# Patient Record
Sex: Male | Born: 1995 | Race: White | Hispanic: No | Marital: Married | Smoking: Former smoker
Health system: Southern US, Community
[De-identification: ages and names within clinical notes are randomized; demographics above are authoritative.]

## PROBLEM LIST (undated history)

## (undated) DIAGNOSIS — F909 Attention-deficit hyperactivity disorder, unspecified type: Secondary | ICD-10-CM

## (undated) HISTORY — DX: Attention-deficit hyperactivity disorder, unspecified type: F90.9

## (undated) HISTORY — PX: TONSILLECTOMY: SUR1361

---
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
## 2015-05-22 ENCOUNTER — Ambulatory Visit (INDEPENDENT_AMBULATORY_CARE_PROVIDER_SITE_OTHER): Payer: 59 | Admitting: Emergency Medicine

## 2015-05-22 VITALS — BP 130/90 | HR 63 | Temp 98.2°F | Resp 16 | Ht 73.25 in | Wt 306.0 lb

## 2015-05-22 DIAGNOSIS — L0390 Cellulitis, unspecified: Secondary | ICD-10-CM | POA: Diagnosis not present

## 2015-05-22 DIAGNOSIS — L0291 Cutaneous abscess, unspecified: Secondary | ICD-10-CM

## 2015-05-22 DIAGNOSIS — H6692 Otitis media, unspecified, left ear: Secondary | ICD-10-CM

## 2015-05-22 MED ORDER — AMOXICILLIN-POT CLAVULANATE 875-125 MG PO TABS: 1.0000 | ORAL_TABLET | Freq: Two times a day (BID) | ORAL | Status: AC

## 2015-05-22 NOTE — Patient Instructions (Signed)
I would like you to start the augmentin immediately. I am placing a referral to ENT.  If you develop fever, nausea, vomiting, sob, dizziness, eye pain--you need to return immediately.

## 2015-05-25 NOTE — Progress Notes (Signed)
  Medical screening examination/treatment/procedure(s) were performed by non-physician practitioner and as supervising physician I was immediately available for consultation/collaboration.     

## 2015-05-25 NOTE — Progress Notes (Signed)
[facility] [address], [place] [address] [phone]  Date:  05/22/2015   Name:  Shaun BaileyJohnathan L Gutierrez   DOB:  12/19/1995   MRN:  [id number]  PCP:  No primary care provider on file.   Chief Complaint  Patient presents with  . Otalgia    left ear x 2 days   . Flu Vaccine     History of Present Illness:  Shaun Gutierrez is a 19 y.o. male patient [facility] progressively worsening ear pain.  He states it is a throbbing pain at his ear, with pain that is radiating to his the left side of his face.   He has no hearing loss.  He did have a blood clot that came from his ear.  No dysequilibrium.  No fever, nausea, or dizziness.   No recent hx of upper respiratory disease.   No hx of ear infections.   There are no active problems to display for this patient.   Past Medical History  Diagnosis Date  . ADHD (attention deficit hyperactivity disorder)     History reviewed. No pertinent past surgical history.  Social History  Substance Use Topics  . Smoking status: Never Smoker   . Smokeless tobacco: None  . Alcohol Use: No    History reviewed. No pertinent family history.  No Known Allergies  Medication list has been reviewed and updated.  No current outpatient prescriptions on file prior to visit.   No current facility-administered medications on file prior to visit.    ROS ROS otherwise unremarkable unless listed above  Physical Examination: BP 130/90 mmHg  Pulse 63  Temp(Src) 98.2 F (36.8 C) (Oral)  Resp 16  Ht 6' 1.25" (1.861 m)  Wt 306 lb (138.801 kg)  BMI 40.08 kg/m2  SpO2 97% Ideal Body Weight: Weight in (lb) to have BMI = 25: 190.4  Physical Exam  Constitutional: He is oriented to person, place, and time. He appears well-developed and well-nourished. No distress.  HENT:  Head: Normocephalic and atraumatic.  Right Ear: Tympanic membrane, external ear and ear canal normal.  Left Ear: External  ear normal. No mastoid tenderness. Tympanic membrane is not perforated.  Distal canal with pus-letting abscess.  TM mildy erythematous at the more 8-10 o'clock region.  No perforation.  No pain with palpaton of pre-auricular.    Eyes: Conjunctivae and EOM are normal. Pupils are equal, round, and reactive to light.  Cardiovascular: Normal rate.   Pulmonary/Chest: Effort normal. No respiratory distress.  Lymphadenopathy:       Head (right side): No preauricular and no posterior auricular adenopathy present.       Head (left side): No preauricular and no posterior auricular adenopathy present.  Neurological: He is alert and oriented to person, place, and time.  Skin: Skin is warm and dry. He is not diaphoretic.  Psychiatric: He has a normal mood and affect. His behavior is normal.     Assessment and Plan: Shaun Gutierrez is a 19 y.o. male who is here today for left ear pain. Abscess of the distal portion.  Wound culture gathered.  Started on augmentin at this itme.    Cellulitis and abscess - Plan: amoxicillin-clavulanate (AUGMENTIN) 875-125 MG tablet, Wound culture, CANCELED: Ambulatory referral to ENT  Subacute otitis media of left ear, recurrence not specified, unspecified otitis media type - Plan: amoxicillin-clavulanate (AUGMENTIN) 875-125 MG tablet, Wound culture, CANCELED: Ambulatory referral to ENT  Trena PlattStephanie Da Michelle,  PA-C [facility] 11/15/201610:49 AM  Addendum: patient reports on day 3 that the pain has improved dramatically.  Some blood with drainage, but not tender.  No facial pain.  No fever.  Continues to take antibiotic.  I will cancel referral to ENT.  Advised patient that if pain returns, I will send urgent ent referral.  Patient agrees with plan.

## 2015-05-26 LAB — WOUND CULTURE
GRAM STAIN: NONE SEEN
Gram Stain: NONE SEEN

## 2015-10-02 ENCOUNTER — Ambulatory Visit (INDEPENDENT_AMBULATORY_CARE_PROVIDER_SITE_OTHER): Payer: BLUE CROSS/BLUE SHIELD | Admitting: Family Medicine

## 2015-10-02 VITALS — BP 118/72 | HR 61 | Temp 98.3°F | Resp 16 | Ht 73.0 in | Wt 307.0 lb

## 2015-10-02 DIAGNOSIS — B081 Molluscum contagiosum: Secondary | ICD-10-CM

## 2015-10-02 NOTE — Progress Notes (Signed)
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 It is not her for itch.  Patient has not had any unprotected intercourse in over 2 years.  Objective:BP 118/72 mmHg  Pulse 61  Temp(Src) 98.3 F (36.8 C) (Oral)  Resp 16  Ht 6\' 1"  (1.854 m)  Wt 307 lb (139.254 kg)  BMI 40.51 kg/m2  SpO2 98% Patient has 3 umbilicated papules with white thick fluid on left side of the shaft of his penis.   Molluscum contagiosum  Patient given a prescription for trichloracetic acid 20% and told to apply sparingly with a toothpick to the lesions daily until they disappear.   Signed, Elvina SidleKurt Hollace Michelli, MD

## 2015-10-02 NOTE — Patient Instructions (Signed)
Molluscum Contagiosum, Adult  Molluscum contagiosum is a skin infection that can cause a rash. When molluscum contagiosum affects the genital area, it is called a sexually transmitted disease (STD).  CAUSES  Molluscum contagiosum is caused by a virus. The virus can spread easily from person to person through:  · Skin-to-skin contact with an infected person.  · Contact with an infected object, such as a towel or clothing.  RISK FACTORS  You may be at higher risk for molluscum contagiosum if you:  · Live in an area where the weather is moist and warm.  · Have a weak body defense system (immune system).  SIGNS AND SYMPTOMS  The main symptom is a rash that appears 2-7 weeks after exposure to the virus. It is made up of 2-20 small, firm, dome-shaped bumps that may:  · Be pink or flesh-colored.  · Appear alone or in groups.  · Range from the size of a pinhead to the size of a pencil eraser.  · Feel smooth and waxy.  · Have a pit in the middle.  · Itch. The rash does not itch for most people.  The bumps often appears on the genitals, thighs, face, neck, and abdomen.  DIAGNOSIS   A health care provider can usually diagnose molluscum contagiosum by looking at the bumps on your skin. To confirm the diagnosis, your health care provider may scrape the bumps to collect a skin sample to examine under a microscope.  TREATMENT  The bumps may go away on their own, but people often have treatment to keep the virus from infecting someone else or to keep the rash from spreading to other body parts. Treatment may include:  · Surgery to remove the bumps by freezing them (cryosurgery).  · A procedure to scrape off the bumps (curettage).  · A procedure to remove the bumps with a laser.  · Putting medicine on the bumps (topical treatment).  Sometimes no treatment is needed.   HOME CARE INSTRUCTIONS  · Take medicines only as directed by your health care provider.  · As long as you have bumps on your skin, the infection can spread to others  and to other parts of your body. To prevent this from happening:    Do not scratch the bumps.    Do not share clothing or towels with others until the bumps disappear.      Avoid close contact with others until the bumps disappear. This includes sexual contact.     Wash your hands often.    Cover the bumps with clothing or a bandage when you will be near other people.  SEEK MEDICAL CARE IF:  · The bumps are spreading.  · The bumps are becoming red and sore.  · The bumps have not gone away after 12 months.     This information is not intended to replace advice given to you by your health care provider. Make sure you discuss any questions you have with your health care provider.     Document Released: 01/21/2014 Document Reviewed: 01/21/2014  [employer] Interactive Patient Education ©2016 [employer]

## 2018-03-21 ENCOUNTER — Encounter: Payer: Self-pay | Admitting: Medical

## 2018-03-21 ENCOUNTER — Ambulatory Visit (INDEPENDENT_AMBULATORY_CARE_PROVIDER_SITE_OTHER): Payer: BLUE CROSS/BLUE SHIELD | Admitting: Medical

## 2018-03-21 VITALS — BP 120/70 | HR 70 | Temp 98.4°F | Resp 16 | Ht 72.5 in | Wt 320.6 lb

## 2018-03-21 DIAGNOSIS — Z113 Encounter for screening for infections with a predominantly sexual mode of transmission: Secondary | ICD-10-CM | POA: Diagnosis not present

## 2018-03-21 DIAGNOSIS — R51 Headache: Secondary | ICD-10-CM | POA: Diagnosis not present

## 2018-03-21 DIAGNOSIS — Z0000 Encounter for general adult medical examination without abnormal findings: Secondary | ICD-10-CM | POA: Diagnosis not present

## 2018-03-21 DIAGNOSIS — R519 Headache, unspecified: Secondary | ICD-10-CM | POA: Insufficient documentation

## 2018-03-21 DIAGNOSIS — E669 Obesity, unspecified: Secondary | ICD-10-CM | POA: Diagnosis not present

## 2018-03-21 DIAGNOSIS — Z23 Encounter for immunization: Secondary | ICD-10-CM

## 2018-03-21 DIAGNOSIS — R0683 Snoring: Secondary | ICD-10-CM | POA: Insufficient documentation

## 2018-03-21 LAB — POCT URINALYSIS DIP (PROADVANTAGE DEVICE)
Bilirubin, UA: NEGATIVE
Blood, UA: NEGATIVE
Glucose, UA: NEGATIVE mg/dL
Ketones, POC UA: NEGATIVE mg/dL
LEUKOCYTES UA: NEGATIVE
NITRITE UA: NEGATIVE
PROTEIN UA: NEGATIVE mg/dL
SPECIFIC GRAVITY, URINE: 1.02
UUROB: NEGATIVE
pH, UA: 6 (ref 5.0–8.0)

## 2018-03-21 NOTE — Progress Notes (Signed)
Subjective:   HPI  Shaun Gutierrez is a 22 y.o. male who presents for new patient physical Chief Complaint  Patient presents with  . CPE    cpe non fasting    Medical care team includes: Leshay Desaulniers, Kermit Balo, PA-C here for primary care Dentist Eye doctor  Concerns: Headaches regularly for months, a little better of late, but worse daily for months.  He attributes this to poor water intake prior.  generalized headaches.  He has cut back on caffeine.   Reviewed their medical, surgical, family, social, medication, and allergy history and updated chart as appropriate.  Past Medical History:  Diagnosis Date  . ADHD (attention deficit hyperactivity disorder)    diagnosed in childhood by [facility]    Past Surgical History:  Procedure Laterality Date  . TONSILLECTOMY      Social History   Socioeconomic History  . Marital status: Significant Other    Spouse name: Not on file  . Number of children: Not on file  . Years of education: Not on file  . Highest education level: Not on file  Occupational History  . Not on file  Social Needs  . Financial resource strain: Not on file  . Food insecurity:    Worry: Not on file    Inability: Not on file  . Transportation needs:    Medical: Not on file    Non-medical: Not on file  Tobacco Use  . Smoking status: Former Smoker    Years: 1.00  . Smokeless tobacco: Never Used  Substance and Sexual Activity  . Alcohol use: Yes    Alcohol/week: 0.0 standard drinks    Comment: occasional  . Drug use: No  . Sexual activity: Yes  Lifestyle  . Physical activity:    Days per week: Not on file    Minutes per session: Not on file  . Stress: Not on file  Relationships  . Social connections:    Talks on phone: Not on file    Gets together: Not on file    Attends religious service: Not on file    Active member of club or organization: Not on file    Attends meetings of clubs or organizations: Not on file    Relationship  status: Not on file  . Intimate partner violence:    Fear of current or ex partner: Not on file    Emotionally abused: Not on file    Physically abused: Not on file    Forced sexual activity: Not on file  Other Topics Concern  . Not on file  Social History Narrative   Engaged, Pensions consultant at [employer], exercise - not much other than walking.   No children.   Planning to marry 10/2019.  03/2018    Family History  Problem Relation Age of Onset  . Headache Mother   . Seizures Mother        as a child  . Anxiety disorder Father   . Depression Father   . Diabetes Maternal Grandmother   . Heart disease Other   . Cancer Neg Hx   . Stroke Neg Hx     No current outpatient medications on file.  No Known Allergies    Review of Systems Constitutional: -fever, -chills, -sweats, -unexpected weight change, -decreased appetite, -fatigue Allergy: -sneezing, -itching, -congestion Dermatology: -changing moles, --rash, -lumps ENT: -runny nose, -ear pain, -sore throat, -hoarseness, -sinus pain, -teeth pain, - ringing in ears, -hearing loss, -nosebleeds Cardiology: -chest pain, -palpitations, -  swelling, -difficulty breathing when lying flat, -waking up short of breath Respiratory: -cough, -shortness of breath, -difficulty breathing with exercise or exertion, -wheezing, -coughing up blood Gastroenterology: -abdominal pain, -nausea, -vomiting, -diarrhea, -constipation, -blood in stool, -changes in bowel movement, -difficulty swallowing or eating Hematology: -bleeding, -bruising  Musculoskeletal: -joint aches, -muscle aches, -joint swelling, -back pain, -neck pain, -cramping, -changes in gait Ophthalmology: denies vision changes, eye redness, itching, discharge Urology: -burning with urination, -difficulty urinating, -blood in urine, -urinary frequency, -urgency, -incontinence Neurology: +headache, -weakness, -tingling, -numbness, -memory loss, -falls, -dizziness Psychology: -depressed  mood, -agitation, -sleep problems Male GU: no testicular mass, pain, no lymph nodes swollen, no swelling, no rash.     Objective:  BP 120/70   Pulse 70   Temp 98.4 F (36.9 C) (Oral)   Resp 16   Ht 6' 0.5" (1.842 m)   Wt (!) 320 lb 9.6 oz (145.4 kg)   SpO2 97%   BMI 42.88 kg/m   General appearance: alert, no distress, WD/WN, Caucasian male, obese Skin: scattered macules, no worrisome lesions HEENT: normocephalic, conjunctiva/corneas normal, sclerae anicteric, PERRLA, EOMi, nares patent, no discharge or erythema, pharynx normal Oral cavity: MMM, tongue normal, teeth in good repair, but moderate plaque Neck: supple, no lymphadenopathy, no thyromegaly, no masses, normal ROM, no bruits Chest: non tender, normal shape and expansion Heart: RRR, normal S1, S2, no murmurs Lungs: CTA bilaterally, no wheezes, rhonchi, or rales Abdomen: +bs, soft, non tender, non distended, no masses, no hepatomegaly, no splenomegaly, no bruits Back: non tender, normal ROM, no scoliosis Musculoskeletal: upper extremities non tender, no obvious deformity, normal ROM throughout, lower extremities non tender, no obvious deformity, normal ROM throughout Extremities: no edema, no cyanosis, no clubbing Pulses: 2+ symmetric, upper and lower extremities, normal cap refill Neurological: alert, oriented x 3, CN2-12 intact, strength normal upper extremities and lower extremities, sensation normal throughout, DTRs 2+ throughout, no cerebellar signs, gait normal Psychiatric: normal affect, behavior normal, pleasant  GU: normal male external genitalia,circumcised, nontender, no masses, no hernia, no lymphadenopathy Rectal: deferred   Assessment and Plan :   Encounter Diagnoses  Name Primary?  . Routine general medical examination at a health care facility Yes  . Frequent headaches   . Obesity, unspecified classification, unspecified obesity type, unspecified whether serious comorbidity present   . Screen for STD  (sexually transmitted disease)   . Need for influenza vaccination   . Need for Td vaccine   . Snoring     Physical exam - discussed and counseled on healthy lifestyle, diet, exercise, preventative care, vaccinations, sick and well care, proper use of emergency dept and after hours care, and addressed their concerns.    Health screening: See your eye doctor yearly for routine vision care. See your dentist yearly for routine dental care including hygiene visits twice yearly.  Discussed STD testing, discussed prevention, condom use, means of transmission  Cancer screening Advised monthly self testicular exam  Vaccinations: Advised yearly influenza vaccine Counseled on the influenza virus vaccine.  Vaccine information sheet given.  Influenza vaccine given after consent obtained. Counseled on the Td (tetanus, diptheria) vaccine.  Vaccine information sheet given. Td vaccine given after consent obtained. He is otherwise up to date on vaccines.  Reviewed the [employer]. He is up to date on HPV series  Acute issues discussed: none  Separate significant chronic issues discussed: Obesity, snoring - discussed possibility of sleep apnea.   Advised lifestyle changes, counseled on diet and exercise  headaches - improved of  late. Discussed possible causes, contributing factors.  May possibly have OSA.   Counseled on lifestyle changes.   F/u pending labs.    Chiron was seen today for cpe.  Diagnoses and all orders for this visit:  Routine general medical examination at a health care facility -     POCT Urinalysis DIP (Proadvantage Device) -     Comprehensive metabolic panel -     CBC -     Lipid panel -     Hemoglobin A1c -     TSH -     HIV antibody -     RPR -     GC/Chlamydia Probe Amp  Frequent headaches  Obesity, unspecified classification, unspecified obesity type, unspecified whether serious comorbidity present -     Hemoglobin A1c -      TSH  Screen for STD (sexually transmitted disease) -     HIV antibody -     RPR -     GC/Chlamydia Probe Amp  Need for influenza vaccination -     Flu Vaccine QUAD 6+ mos PF IM (Fluarix Quad PF)  Need for Td vaccine -     Cancel: Td : Tetanus/diphtheria >7yo Preservative  free -     Cancel: Td : Tetanus/diphtheria >7yo Preservative  free -     Tdap vaccine greater than or equal to 7yo IM  Snoring    Follow-up pending labs, yearly for physical

## 2018-03-22 LAB — TSH: TSH: 2.45 u[IU]/mL (ref 0.450–4.500)

## 2018-03-22 LAB — COMPREHENSIVE METABOLIC PANEL
ALT: 38 IU/L (ref 0–44)
AST: 23 IU/L (ref 0–40)
Albumin/Globulin Ratio: 1.7 (ref 1.2–2.2)
Albumin: 4.7 g/dL (ref 3.5–5.5)
Alkaline Phosphatase: 79 IU/L (ref 39–117)
BILIRUBIN TOTAL: 0.6 mg/dL (ref 0.0–1.2)
BUN/Creatinine Ratio: 11 (ref 9–20)
BUN: 10 mg/dL (ref 6–20)
CHLORIDE: 100 mmol/L (ref 96–106)
CO2: 22 mmol/L (ref 20–29)
CREATININE: 0.93 mg/dL (ref 0.76–1.27)
Calcium: 9.9 mg/dL (ref 8.7–10.2)
GFR calc Af Amer: 135 mL/min/{1.73_m2} (ref >59)
GFR calc non Af Amer: 117 mL/min/{1.73_m2} (ref >59)
GLUCOSE: 70 mg/dL (ref 65–99)
Globulin, Total: 2.8 g/dL (ref 1.5–4.5)
Potassium: 4.3 mmol/L (ref 3.5–5.2)
Sodium: 140 mmol/L (ref 134–144)
Total Protein: 7.5 g/dL (ref 6.0–8.5)

## 2018-03-22 LAB — HEMOGLOBIN A1C
Est. average glucose Bld gHb Est-mCnc: 97 mg/dL
Hgb A1c MFr Bld: 5 % (ref 4.8–5.6)

## 2018-03-22 LAB — LIPID PANEL
CHOLESTEROL TOTAL: 176 mg/dL (ref 100–199)
Chol/HDL Ratio: 4.3 ratio (ref 0.0–5.0)
HDL: 41 mg/dL (ref >39)
LDL Calculated: 108 mg/dL — ABNORMAL HIGH (ref 0–99)
Triglycerides: 136 mg/dL (ref 0–149)
VLDL CHOLESTEROL CAL: 27 mg/dL (ref 5–40)

## 2018-03-22 LAB — RPR: RPR Ser Ql: NONREACTIVE

## 2018-03-22 LAB — CBC
Hematocrit: 42.7 % (ref 37.5–51.0)
Hemoglobin: 14.6 g/dL (ref 13.0–17.7)
MCH: 30.2 pg (ref 26.6–33.0)
MCHC: 34.2 g/dL (ref 31.5–35.7)
MCV: 88 fL (ref 79–97)
PLATELETS: 290 10*3/uL (ref 150–450)
RBC: 4.84 x10E6/uL (ref 4.14–5.80)
RDW: 13.5 % (ref 12.3–15.4)
WBC: 9.7 10*3/uL (ref 3.4–10.8)

## 2018-03-22 LAB — HIV ANTIBODY (ROUTINE TESTING W REFLEX): HIV Screen 4th Generation wRfx: NONREACTIVE

## 2018-03-23 LAB — GC/CHLAMYDIA PROBE AMP
CHLAMYDIA, DNA PROBE: NEGATIVE
NEISSERIA GONORRHOEAE BY PCR: NEGATIVE

## 2018-07-05 ENCOUNTER — Telehealth: Payer: Self-pay | Admitting: Medical

## 2018-07-05 ENCOUNTER — Encounter: Payer: Self-pay | Admitting: Medical

## 2018-07-05 ENCOUNTER — Ambulatory Visit: Payer: BLUE CROSS/BLUE SHIELD | Admitting: Medical

## 2018-07-05 VITALS — HR 102 | Temp 97.9°F | Ht 73.0 in | Wt 327.8 lb

## 2018-07-05 DIAGNOSIS — R51 Headache: Secondary | ICD-10-CM

## 2018-07-05 DIAGNOSIS — R0683 Snoring: Secondary | ICD-10-CM | POA: Diagnosis not present

## 2018-07-05 DIAGNOSIS — M5440 Lumbago with sciatica, unspecified side: Secondary | ICD-10-CM | POA: Insufficient documentation

## 2018-07-05 DIAGNOSIS — R519 Headache, unspecified: Secondary | ICD-10-CM

## 2018-07-05 DIAGNOSIS — E669 Obesity, unspecified: Secondary | ICD-10-CM | POA: Diagnosis not present

## 2018-07-05 DIAGNOSIS — I889 Nonspecific lymphadenitis, unspecified: Secondary | ICD-10-CM | POA: Insufficient documentation

## 2018-07-05 DIAGNOSIS — R5383 Other fatigue: Secondary | ICD-10-CM | POA: Insufficient documentation

## 2018-07-05 DIAGNOSIS — R400 Somnolence: Secondary | ICD-10-CM | POA: Insufficient documentation

## 2018-07-05 MED ORDER — CYCLOBENZAPRINE HCL 10 MG PO TABS: 10.0000 mg | ORAL_TABLET | Freq: Every day | ORAL | 0 refills | Status: DC

## 2018-07-05 NOTE — Patient Instructions (Signed)
Recommendations   Over the weekend use Ibuprofen/Advil 200mg , either 3 or 4 tablets at a time (600mg  or 800mg ), up to 3 times daily over the weekend.    Begin Flexeril 10mg , 1/2-1 tablet at bedtime the next few night to ease muscle spasm and tension in left low back  Do stretching daily  You can use heat pad to the low back  If you get worsening pain, worse pain down the leg, worse numbness, tinging or weakness in a leg, then call or return  Rest, avoid any lifting or strenuous activity over the weekend   We will refer you for sleep study   The lymph is inflamed but benign, nothing worrisome

## 2018-07-05 NOTE — Progress Notes (Signed)
Subjective: Chief Complaint  Patient presents with  . Sore Throat  . Back Pain   Here for couple different concerns  Last visit we discussed possibly doing a sleep study given fatigue, headaches, and getting sleepy while driving or daytime somnolence.  He wants to move forward with the sleep study  He is mainly here for back pain for the last 1.5-week.  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  He denies any specific injury or trauma.  He notes pain in the left lower back, worse with bending and moving.  No fever, no belly pain, no genital pain, but he does have some pain going down the left leg mildly.  He is using Aleve some. no other aggravating relieving factors.  He notes a small lymph node or lump seems to be noticeable or inflamed under his right mandible.  No other lumps noted.  He was sick about a week and a half 2 weeks ago with cold symptoms.  But that resolved.   Past Medical History:  Diagnosis Date  . ADHD (attention deficit hyperactivity disorder)    diagnosed in childhood by [facility]   No current outpatient medications on file prior to visit.   No current facility-administered medications on file prior to visit.    ROS as in subjective   Objective: Pulse (!) 102   Temp 97.9 F (36.6 C) (Oral)   Ht 6\' 1"  (1.854 m)   Wt (!) 327 lb 12.8 oz (148.7 kg)   SpO2 96%   BMI 43.25 kg/m   General: Well-developed, well-nourished, no acute distress, white male Skin unremarkable There is a small shotty lymph node in the right submandibular region but no other lymph nodes, nothing concerning. HEENT unremarkable Lungs clear Tender left lower back, paraspinal region tender, spasm noted, mild pain with range of motion which is somewhat decreased due to pain Legs nontender with normal range of motion Legs neurovascularly intact,  normal heel and toe walk, negative straight leg raise, normal DTRs   Assessment: Encounter Diagnoses  Name Primary?  . Fatigue, unspecified type Yes  . Snoring   . Obesity, unspecified classification, unspecified obesity type, unspecified whether serious comorbidity present   . Frequent headaches   . Low back pain with sciatica, sciatica laterality unspecified, unspecified back pain laterality, unspecified chronicity   . Lymphadenitis   . Daytime somnolence     Plan: Low back pain on the left with some mild sciatica-increase over-the-counter ibuprofen as discussed today for 600mg  to 800 mg 2-3 times daily over the weekend only, can use Flexeril as needed, discussed caution with sedation, just use at bedtime, advised on gentle stretching, no heavy lifting or strenuous activity for the next 4 to 5 days at least.  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  Follow-up if not improving within the next week  At his recent physical we had discussed fatigue, snoring, daytime somnolence, frequent headaches-he is ready to move forward sleep study.  Referral for sleep study today  Obesity-continue efforts to lose weight  Lymphadenitis- mild and likely reactive to his recent cold symptoms.  Reassured   Wheeler was seen today for sore throat and back pain.  Diagnoses and all orders  for this visit:  Fatigue, unspecified type  Snoring  Obesity, unspecified classification, unspecified obesity type, unspecified whether serious comorbidity present  Frequent headaches  Low back pain with sciatica, sciatica laterality unspecified, unspecified back pain laterality, unspecified chronicity  Lymphadenitis  Daytime somnolence  Other orders -     cyclobenzaprine (FLEXERIL) 10 MG tablet; Take 1 tablet (10 mg total) by mouth at bedtime.

## 2018-07-05 NOTE — Telephone Encounter (Signed)
Refer for sleep study

## 2018-07-08 NOTE — Telephone Encounter (Signed)
Patient has been referred for sleep study.

## 2018-07-16 ENCOUNTER — Ambulatory Visit
Admission: RE | Admit: 2018-07-16 | Discharge: 2018-07-16 | Disposition: A | Discharge status: Home / Self Care | Payer: Self-pay | Source: Ambulatory Visit | Attending: Medical | Admitting: Medical

## 2018-07-16 ENCOUNTER — Ambulatory Visit: Payer: BLUE CROSS/BLUE SHIELD | Admitting: Medical

## 2018-07-16 ENCOUNTER — Encounter: Payer: Self-pay | Admitting: Medical

## 2018-07-16 VITALS — BP 130/80 | HR 71 | Temp 98.6°F | Resp 16 | Ht 73.0 in | Wt 327.4 lb

## 2018-07-16 DIAGNOSIS — M5442 Lumbago with sciatica, left side: Secondary | ICD-10-CM

## 2018-07-16 DIAGNOSIS — R0683 Snoring: Secondary | ICD-10-CM | POA: Diagnosis not present

## 2018-07-16 DIAGNOSIS — I889 Nonspecific lymphadenitis, unspecified: Secondary | ICD-10-CM

## 2018-07-16 DIAGNOSIS — M79605 Pain in left leg: Secondary | ICD-10-CM

## 2018-07-16 DIAGNOSIS — E669 Obesity, unspecified: Secondary | ICD-10-CM

## 2018-07-16 MED ORDER — TRAMADOL HCL 50 MG PO TABS: 50.0000 mg | ORAL_TABLET | Freq: Three times a day (TID) | ORAL | 0 refills | Status: DC | PRN

## 2018-07-16 NOTE — Progress Notes (Signed)
Subjective: Chief Complaint  Patient presents with  . back pain    back pain worse toward the center of back    Here for recheck on back pain.  I saw him 07/05/18 for same.  He was having a few hx/o back pain, but since last visit no improvement at all, actually worse.  No improvement on flexeril and ibuprofen.  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  He denies any specific injury or trauma.  He notes pain in the left lower back, worse with bending and moving.  No fever, no belly pain, no genital pain, but he does have some pain going down the left leg mildly.    The lymph node from last visit resolved  He still hasn't heard back about sleep study.  Past Medical History:  Diagnosis Date  . ADHD (attention deficit hyperactivity disorder)    diagnosed in childhood by [facility]   Current Outpatient Medications on File Prior to Visit  Medication Sig Dispense Refill  . cyclobenzaprine (FLEXERIL) 10 MG tablet Take 1 tablet (10 mg total) by mouth at bedtime. (Patient not taking: Reported on 07/16/2018) 10 tablet 0   No current facility-administered medications on file prior to visit.    ROS as in subjective   Objective: BP 130/80   Pulse 71   Temp 98.6 F (37 C) (Oral)   Resp 16   Ht 6\' 1"  (1.854 m)   Wt (!) 327 lb 6.4 oz (148.5 kg)   SpO2 97%   BMI 43.20 kg/m   General: Well-developed, well-nourished, no acute distress, white male Skin unremarkable There is a small shotty lymph node in the right submandibular region but no other lymph nodes, nothing concerning. HEENT unremarkable Lungs clear Tender left lower back, paraspinal region tender, spasm noted, mild pain with range of motion which is somewhat decreased due to pain Legs nontender with normal range of motion Legs neurovascularly intact, normal heel and toe walk, negative  straight leg raise, normal DTRs   Assessment: Encounter Diagnoses  Name Primary?  . Acute left-sided low back pain with left-sided sciatica Yes  . Left leg pain   . Lymphadenitis   . Snoring   . Obesity, unspecified classification, unspecified obesity type, unspecified whether serious comorbidity present     Plan: Low back pain on the left with some mild sciatica, not improved with conservative therapy.  Go for xray, ultram prn.  discussed risks/benefits of medication.  Referral for sleep study today  Obesity-continue efforts to lose weight  Lymphadenitis- resolved   Valerie was seen today for back pain.  Diagnoses and all orders for this visit:  Acute left-sided low back pain with left-sided sciatica -     DG Lumbar Spine Complete; Future  Left leg pain -     DG Lumbar Spine Complete; Future  Lymphadenitis  Snoring  Obesity, unspecified classification, unspecified obesity type, unspecified whether serious comorbidity present  Other orders -     traMADol (ULTRAM) 50 MG tablet; Take 1 tablet (50 mg total) by mouth every 8 (eight) hours as needed.

## 2018-08-16 ENCOUNTER — Ambulatory Visit (HOSPITAL_BASED_OUTPATIENT_CLINIC_OR_DEPARTMENT_OTHER): Payer: Self-pay | Admitting: Internal Medicine

## 2018-08-19 ENCOUNTER — Encounter (HOSPITAL_BASED_OUTPATIENT_CLINIC_OR_DEPARTMENT_OTHER): Payer: Self-pay

## 2018-08-20 ENCOUNTER — Ambulatory Visit (HOSPITAL_BASED_OUTPATIENT_CLINIC_OR_DEPARTMENT_OTHER): Payer: Self-pay | Attending: Medical

## 2018-08-31 DIAGNOSIS — G4733 Obstructive sleep apnea (adult) (pediatric): Secondary | ICD-10-CM

## 2018-08-31 NOTE — Procedures (Signed)
   Patient Name: Carlisle, Paszkiewicz Date: 08/20/2018 Gender: Male D.O.B: 1996/04/23 Age (years): 22 Referring Provider: Crosby Oyster Height (inches): 73 Interpreting Physician: Jetty Duhamel MD, ABSM Weight (lbs): 320 RPSGT: Elaina Pattee BMI: 42 MRN: [id number] Neck Size: 17.50  CLINICAL INFORMATION Sleep Study Type: HST Indication for sleep study: OSA Epworth Sleepiness Score: 12  SLEEP STUDY TECHNIQUE A multi-channel overnight portable sleep study was performed. The channels recorded were: nasal airflow, thoracic respiratory movement, and oxygen saturation with a pulse oximetry. Snoring was also monitored.  MEDICATIONS Patient self administered medications include: none reported.  SLEEP ARCHITECTURE Patient was studied for 386.5 minutes. The sleep efficiency was 100.0 % and the patient was supine for 99.2%. The arousal index was 0.0 per hour.  RESPIRATORY PARAMETERS The overall AHI was 13.0 per hour, with a central apnea index of 0.0 per hour. The oxygen nadir was 77% during sleep.  CARDIAC DATA Mean heart rate during sleep was 66.1 bpm.  IMPRESSIONS - Mild obstructive sleep apnea occurred during this study (AHI = 13.0/h). - No significant central sleep apnea occurred during this study (CAI = 0.0/h). - Oxygen desaturation was noted during this study (Min O2 = 77%). Mean sat 94%. - Patient snored.  DIAGNOSIS - Obstructive Sleep Apnea (327.23 [G47.33 ICD-10])  RECOMMENDATIONS - Treatment for mild OSA is directed at symptoms. Conservative meaasures may include observation, weight loss, and sleep position off back. - Other options may include CPAP, Sleep medical consultation, a fitted oral appliance or ENT evaluation. - Be careful with alcohol, sedatives and other CNS depressants that may worsen sleep apnea and disrupt normal sleep architecture. - Sleep hygiene should be reviewed to assess factors that may improve sleep quality. - Weight management and  regular exercise should be initiated or continued.  [Electronically signed] 08/31/2018 11:05 AM  Jetty Duhamel MD, ABSM Diplomate, American Board of Sleep Medicine   NPI: [id number]                          Jetty Duhamel Diplomate, American Board of Sleep Medicine  ELECTRONICALLY SIGNED ON:  08/31/2018, 11:02 AM [facility] PH: [phone]   FX: [phone] ACCREDITED BY THE AMERICAN ACADEMY OF SLEEP MEDICINE

## 2018-09-04 ENCOUNTER — Encounter: Payer: Self-pay | Admitting: Medical

## 2018-09-04 ENCOUNTER — Ambulatory Visit: Payer: BLUE CROSS/BLUE SHIELD | Admitting: Medical

## 2018-09-04 VITALS — BP 130/84 | HR 74 | Temp 98.1°F | Resp 16 | Ht 73.0 in | Wt 322.4 lb

## 2018-09-04 DIAGNOSIS — R5383 Other fatigue: Secondary | ICD-10-CM | POA: Diagnosis not present

## 2018-09-04 DIAGNOSIS — R51 Headache: Secondary | ICD-10-CM | POA: Diagnosis not present

## 2018-09-04 DIAGNOSIS — E669 Obesity, unspecified: Secondary | ICD-10-CM

## 2018-09-04 DIAGNOSIS — G4733 Obstructive sleep apnea (adult) (pediatric): Secondary | ICD-10-CM

## 2018-09-04 DIAGNOSIS — R0683 Snoring: Secondary | ICD-10-CM | POA: Diagnosis not present

## 2018-09-04 DIAGNOSIS — R519 Headache, unspecified: Secondary | ICD-10-CM

## 2018-09-04 NOTE — Progress Notes (Signed)
  Subjective:     Patient ID: Shaun Gutierrez, male   DOB: 1995-12-19, 23 y.o.   MRN: [id number]  HPI Chief Complaint  Patient presents with  . consult    discuss sleep test results.     Here to discussed sleep study.   He reports snoring, fatigue, daytime somnolence, decreased energy.   No new c/o.    Past Medical History:  Diagnosis Date  . ADHD (attention deficit hyperactivity disorder)    diagnosed in childhood by [facility]   Current Outpatient Medications on File Prior to Visit  Medication Sig Dispense Refill  . traMADol (ULTRAM) 50 MG tablet Take 1 tablet (50 mg total) by mouth every 8 (eight) hours as needed. (Patient not taking: Reported on 09/04/2018) 15 tablet 0   No current facility-administered medications on file prior to visit.    Family History  Problem Relation Age of Onset  . Headache Mother   . Seizures Mother        as a child  . Anxiety disorder Father   . Depression Father   . Diabetes Maternal Grandmother   . Heart disease Other   . Cancer Neg Hx   . Stroke Neg Hx     Review of Systems ROS as in subjective    Objective:   Physical Exam BP 130/84   Pulse 74   Temp 98.1 F (36.7 C) (Oral)   Resp 16   Ht 6\' 1"  (1.854 m)   Wt (!) 322 lb 6.4 oz (146.2 kg)   SpO2 97%   BMI 42.54 kg/m   Wt Readings from Last 3 Encounters:  09/04/18 (!) 322 lb 6.4 oz (146.2 kg)  07/16/18 (!) 327 lb 6.4 oz (148.5 kg)  07/05/18 (!) 327 lb 12.8 oz (148.7 kg)   Gen: wd, wn ,nad Oral: slightly reduced airway, s/p tonsillectomy Large diam neck       Assessment:     Encounter Diagnoses  Name Primary?  . OSA (obstructive sleep apnea) Yes  . Frequent headaches   . Fatigue, unspecified type   . Snoring   . Obesity, unspecified classification, unspecified obesity type, unspecified whether serious comorbidity present        Plan:      I reviewed his sleep study results showing AHI 13/h, oxygen nadir was 77%, mean sat was 94%  We  discussed remedies to help with sleep apnea including weight loss, raising the head of the bed, avoiding sedatives, limiting alcohol, consider oral airway device from an oral surgeon, and CPAP device.  He agrees to trial of CPAP.  He is continuing to make lifestyle changes to help lose weight and has lost 5 pounds since his last visit here.  We discussed CPAP use, follow-up, goals of therapy.  Follow-up in 1 month

## 2018-09-05 ENCOUNTER — Other Ambulatory Visit: Payer: Self-pay

## 2018-09-05 DIAGNOSIS — G4733 Obstructive sleep apnea (adult) (pediatric): Secondary | ICD-10-CM

## 2018-09-18 ENCOUNTER — Telehealth: Payer: Self-pay

## 2018-09-18 NOTE — Telephone Encounter (Signed)
I would recommend he call and talk to insurance about this, and what is there options or recommendations for him if its too expensive?  Generally we use a home health supplier to set up the device and ensure proper pressure settings and monitoring.

## 2018-09-18 NOTE — Telephone Encounter (Signed)
Patient states that he can not afford his CPAP through his insurance.  Patient would like to get a RX for CPAP written out for him to pick up.  Please advise

## 2018-09-19 NOTE — Telephone Encounter (Signed)
Patient advised, he stated that he will call his insurance and call us back.

## 2018-11-18 ENCOUNTER — Telehealth: Payer: Self-pay | Admitting: Family Medicine

## 2018-11-18 NOTE — Telephone Encounter (Signed)
Script ready.

## 2018-11-18 NOTE — Telephone Encounter (Signed)
Pt called and needs written rx for CPAP supplies.  Please handwrite and he will pick up.  He is trying to find the cheapest place to purchase.  He will call from car.

## 2018-11-18 NOTE — Telephone Encounter (Signed)
Called pt and let him know the RX was ready

## 2019-03-19 ENCOUNTER — Encounter: Payer: Self-pay | Admitting: Medical

## 2019-03-19 ENCOUNTER — Ambulatory Visit: Payer: BLUE CROSS/BLUE SHIELD | Admitting: Medical

## 2019-03-19 ENCOUNTER — Other Ambulatory Visit: Payer: Self-pay

## 2019-03-19 VITALS — BP 122/80 | HR 72 | Temp 98.1°F | Ht 73.0 in | Wt 338.4 lb

## 2019-03-19 DIAGNOSIS — Z113 Encounter for screening for infections with a predominantly sexual mode of transmission: Secondary | ICD-10-CM | POA: Diagnosis not present

## 2019-03-19 DIAGNOSIS — N50819 Testicular pain, unspecified: Secondary | ICD-10-CM | POA: Diagnosis not present

## 2019-03-19 LAB — POCT URINALYSIS DIP (PROADVANTAGE DEVICE)
Bilirubin, UA: NEGATIVE
Blood, UA: NEGATIVE
Glucose, UA: NEGATIVE mg/dL
Nitrite, UA: NEGATIVE
Protein Ur, POC: NEGATIVE mg/dL
Specific Gravity, Urine: 1.02
Urobilinogen, Ur: NEGATIVE
pH, UA: 6 (ref 5.0–8.0)

## 2019-03-19 NOTE — Addendum Note (Signed)
Addended by: Edgar Frisk on: 03/19/2019 09:55 AM   Modules accepted: Orders

## 2019-03-19 NOTE — Addendum Note (Signed)
Addended by: Carlena Hurl on: 03/19/2019 09:51 AM   Modules accepted: Orders

## 2019-03-19 NOTE — Progress Notes (Addendum)
Subjective: Chief Complaint  Patient presents with  . Testicle Pain    x1 month worsening over the past 2 weeks    Here for testicular concerns.  He notes several week hx/o discomfort in right testicle as if someone hit him in testicle, worsening to constant now.  No injury, no trauma.  Not sure about swelling, but right feels bigger. No penile discomfort, no discharge.   No urinary change, no frequency, no urgency, no pain with urination.  No blood in urine.  No rash or genitals.  Sexually active, same partner x 1 year.  Last STD screen a few years ago.  No other aggravating or relieving factors. No other complaint.  ROS as in subjective  Objective: BP 122/80   Pulse 72   Temp 98.1 F (36.7 C) (Oral)   Ht 6\' 1"  (1.854 m)   Wt (!) 338 lb 6.4 oz (153.5 kg)   SpO2 98%   BMI 44.65 kg/m   Gen: wd, wn, nad Normal male, circ, tender over right testicle that seems mildly swollen, but no obvious mass, no left testicular abnormality, no lymphadenopathy, no hernia, no skin lesions Abdomen: +bs, soft, nontender, no mass, no organomegaly    Assessment: Encounter Diagnoses  Name Primary?  . Testicular discomfort Yes  . Screen for STD (sexually transmitted disease)      Plan: Discussed differential including STI, prostatitis, testicular lesions, doubt torsion given on injury or activity to cause, and based on mild tendnerss on exam and no other abnormality on exam.   UA reviewed. Other labs as below.  Await results .  Davelle was seen today for testicle pain.  Diagnoses and all orders for this visit:  Testicular discomfort -     HIV Antibody (routine testing w rflx) -     RPR -     GC/Chlamydia Probe Amp -     Urine Culture  Screen for STD (sexually transmitted disease) -     HIV Antibody (routine testing w rflx) -     RPR -     GC/Chlamydia Probe Amp -     Urine Culture

## 2019-03-20 LAB — HIV ANTIBODY (ROUTINE TESTING W REFLEX): HIV Screen 4th Generation wRfx: NONREACTIVE

## 2019-03-20 LAB — RPR: RPR Ser Ql: NONREACTIVE

## 2019-03-20 LAB — URINE CULTURE

## 2019-03-21 ENCOUNTER — Other Ambulatory Visit: Payer: Self-pay | Admitting: Medical

## 2019-03-21 LAB — GC/CHLAMYDIA PROBE AMP
Chlamydia trachomatis, NAA: NEGATIVE
Neisseria Gonorrhoeae by PCR: NEGATIVE

## 2019-03-21 MED ORDER — SULFAMETHOXAZOLE-TRIMETHOPRIM 800-160 MG PO TABS: 1.0000 | ORAL_TABLET | Freq: Two times a day (BID) | ORAL | 0 refills | Status: DC

## 2022-03-15 ENCOUNTER — Encounter: Payer: Self-pay | Admitting: Internal Medicine

## 2022-04-18 ENCOUNTER — Encounter: Payer: Self-pay | Admitting: Internal Medicine

## 2023-02-27 ENCOUNTER — Emergency Department (HOSPITAL_COMMUNITY): Payer: Worker's Compensation

## 2023-02-27 ENCOUNTER — Other Ambulatory Visit: Payer: Self-pay

## 2023-02-27 ENCOUNTER — Encounter (HOSPITAL_COMMUNITY): Payer: Self-pay

## 2023-02-27 ENCOUNTER — Emergency Department (HOSPITAL_COMMUNITY)
Admission: EM | Admit: 2023-02-27 | Discharge: 2023-02-27 | Disposition: A | Discharge status: Home / Self Care | Payer: Worker's Compensation

## 2023-02-27 DIAGNOSIS — S0990XA Unspecified injury of head, initial encounter: Secondary | ICD-10-CM | POA: Diagnosis not present

## 2023-02-27 DIAGNOSIS — S199XXA Unspecified injury of neck, initial encounter: Secondary | ICD-10-CM | POA: Diagnosis present

## 2023-02-27 DIAGNOSIS — S161XXA Strain of muscle, fascia and tendon at neck level, initial encounter: Secondary | ICD-10-CM | POA: Diagnosis not present

## 2023-02-27 DIAGNOSIS — Y92410 Unspecified street and highway as the place of occurrence of the external cause: Secondary | ICD-10-CM | POA: Insufficient documentation

## 2023-02-27 MED ORDER — OXYCODONE-ACETAMINOPHEN 5-325 MG PO TABS
1.0000 | ORAL_TABLET | Freq: Once | ORAL | Status: AC
  Administered 2023-02-27: 1 via ORAL
  Filled 2023-02-27: qty 1

## 2023-02-27 MED ORDER — KETOROLAC TROMETHAMINE 15 MG/ML IJ SOLN
15.0000 mg | Freq: Once | INTRAMUSCULAR | Status: AC
  Administered 2023-02-27: 15 mg via INTRAMUSCULAR
  Filled 2023-02-27: qty 1

## 2023-02-27 MED ORDER — METHOCARBAMOL 750 MG PO TABS: 750.0000 mg | ORAL_TABLET | Freq: Three times a day (TID) | ORAL | 0 refills | Status: AC | PRN

## 2023-02-27 MED ORDER — KETOROLAC TROMETHAMINE 10 MG PO TABS: 10.0000 mg | ORAL_TABLET | Freq: Four times a day (QID) | ORAL | 0 refills | Status: AC | PRN
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
## 2023-02-27 NOTE — Discharge Instructions (Signed)
Your CT scans and x-ray today were reassuring.  Please take the ketorolac and Robaxin as needed for your symptoms.  Do not drive or drink alcohol taking the Robaxin as it may make you drowsy.  Please follow-up with your doctor if your symptoms are improving.  If you continue to have worsening pain please follow-up with the neurosurgeon at the number provided.  If you develop any weakness in your arms or legs please return immediately to the emergency department for reevaluation.  Return to the ER for worsening symptoms.

## 2023-02-27 NOTE — ED Notes (Signed)
Pt able to ambulate complaining of neck pain while walking.

## 2023-03-20 ENCOUNTER — Emergency Department (HOSPITAL_COMMUNITY)
Admission: EM | Admit: 2023-03-20 | Discharge: 2023-03-21 | Discharge status: Against Medical Advice | Payer: Worker's Compensation | Attending: Emergency Medicine | Admitting: Emergency Medicine

## 2023-03-20 ENCOUNTER — Other Ambulatory Visit: Payer: Self-pay

## 2023-03-20 DIAGNOSIS — Z5321 Procedure and treatment not carried out due to patient leaving prior to being seen by health care provider: Secondary | ICD-10-CM | POA: Diagnosis not present

## 2023-03-20 DIAGNOSIS — M25571 Pain in right ankle and joints of right foot: Secondary | ICD-10-CM | POA: Insufficient documentation

## 2023-03-20 DIAGNOSIS — W010XXA Fall on same level from slipping, tripping and stumbling without subsequent striking against object, initial encounter: Secondary | ICD-10-CM | POA: Diagnosis not present

## 2023-03-20 NOTE — ED Notes (Signed)
PT stated he was leaving and was seen leaving the ED
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
## 2024-04-21 ENCOUNTER — Emergency Department (HOSPITAL_BASED_OUTPATIENT_CLINIC_OR_DEPARTMENT_OTHER): Payer: Self-pay | Admitting: Radiology

## 2024-04-21 ENCOUNTER — Other Ambulatory Visit: Payer: Self-pay

## 2024-04-21 ENCOUNTER — Emergency Department (HOSPITAL_BASED_OUTPATIENT_CLINIC_OR_DEPARTMENT_OTHER)
Admission: EM | Admit: 2024-04-21 | Discharge: 2024-04-21 | Disposition: A | Discharge status: Home / Self Care | Payer: Self-pay | Attending: Emergency Medicine | Admitting: Emergency Medicine

## 2024-04-21 ENCOUNTER — Encounter (HOSPITAL_BASED_OUTPATIENT_CLINIC_OR_DEPARTMENT_OTHER): Payer: Self-pay | Admitting: Emergency Medicine

## 2024-04-21 DIAGNOSIS — Z87891 Personal history of nicotine dependence: Secondary | ICD-10-CM | POA: Insufficient documentation

## 2024-04-21 DIAGNOSIS — D72829 Elevated white blood cell count, unspecified: Secondary | ICD-10-CM | POA: Insufficient documentation

## 2024-04-21 DIAGNOSIS — R002 Palpitations: Secondary | ICD-10-CM | POA: Insufficient documentation

## 2024-04-21 LAB — BASIC METABOLIC PANEL WITH GFR
Anion gap: 11 (ref 5–15)
BUN: 11 mg/dL (ref 6–20)
CO2: 27 mmol/L (ref 22–32)
Calcium: 9.8 mg/dL (ref 8.9–10.3)
Chloride: 102 mmol/L (ref 98–111)
Creatinine, Ser: 0.81 mg/dL (ref 0.61–1.24)
GFR, Estimated: >60 mL/min (ref >60)
Glucose, Bld: 83 mg/dL (ref 70–99)
Potassium: 4.1 mmol/L (ref 3.5–5.1)
Sodium: 141 mmol/L (ref 135–145)

## 2024-04-21 LAB — CBC
HCT: 42.7 % (ref 39.0–52.0)
Hemoglobin: 14.6 g/dL (ref 13.0–17.0)
MCH: 29 pg (ref 26.0–34.0)
MCHC: 34.2 g/dL (ref 30.0–36.0)
MCV: 84.7 fL (ref 80.0–100.0)
Platelets: 310 K/uL (ref 150–400)
RBC: 5.04 MIL/uL (ref 4.22–5.81)
RDW: 13.2 % (ref 11.5–15.5)
WBC: 10.9 K/uL — ABNORMAL HIGH (ref 4.0–10.5)
nRBC: 0 % (ref 0.0–0.2)

## 2024-04-21 LAB — TROPONIN T, HIGH SENSITIVITY: Troponin T High Sensitivity: <15 ng/L (ref 0–19)

## 2024-04-21 LAB — TSH: TSH: 2.38 u[IU]/mL (ref 0.350–4.500)

## 2024-04-21 MED ORDER — ACETAMINOPHEN 500 MG PO TABS
1000.0000 mg | ORAL_TABLET | Freq: Once | ORAL | Status: AC
  Administered 2024-04-21: 1000 mg via ORAL
  Filled 2024-04-21: qty 2

## 2024-04-21 NOTE — ED Provider Notes (Signed)
 [name] Provider Note   [name]: [id number] Arrival date & time: 04/21/24  1638     Patient presents with: Palpitations   Shaun Gutierrez is a 28 y.o. male.   28 year old male presenting with palpitations.  Patient states that symptoms been ongoing for approximately 1 year, have become more frequent over the past month, and more persistent over the past 2 weeks.  He notes episodes of palpitations daily, he describes these as sudden pressure sensation, stating it takes my breath away, will last seconds to minutes at a time, denies associated pain.  Palpitations typically occur during episodes of exertion, however he does note that since he has been in the ED sitting down he has been having frequent episodes despite the fact that he has been sitting still.  No personal cardiac history, no history of sudden cardiac death in any immediate family members.  Patient has an appointment with his PCP to discuss this issue later this month, has never been seen by cardiologist.   Palpitations      Prior to Admission medications   Medication Sig Start Date End Date Taking? Authorizing Provider  ketorolac  (TORADOL ) 10 MG tablet Take 1 tablet (10 mg total) by mouth every 6 (six) hours as needed for moderate pain. 02/27/23   Ula Prentice SAUNDERS, MD  methocarbamol  (ROBAXIN ) 750 MG tablet Take 1 tablet (750 mg total) by mouth every 8 (eight) hours as needed for muscle spasms. 02/27/23   Ula Prentice SAUNDERS, MD  omeprazole (PRILOSEC) 40 MG capsule Take 40 mg by mouth daily. 05/30/22   [provider]    Allergies: Patient has no known allergies.    Review of Systems  Cardiovascular:  Positive for palpitations.    Updated Vital Signs  Vitals:   04/21/24 2030 04/21/24 2039 04/21/24 2042 04/21/24 2045  BP:  (!) 143/90  126/78  Pulse:  91  79  Resp: (!) 23 19  17   Temp:   98.3 F (36.8 C)   TempSrc:   Axillary   SpO2:  95%  95%     Physical  Exam Vitals and nursing note reviewed.  Constitutional:      Appearance: He is obese.  HENT:     Head: Normocephalic.  Eyes:     Extraocular Movements: Extraocular movements intact.  Cardiovascular:     Rate and Rhythm: Normal rate and regular rhythm.     Heart sounds: Normal heart sounds.  Pulmonary:     Effort: Pulmonary effort is normal.     Breath sounds: Normal breath sounds.  Musculoskeletal:     Cervical back: Normal range of motion.     Right lower leg: No edema.     Left lower leg: No edema.     Comments: Moves all extremities spontaneously without difficulty  Skin:    General: Skin is warm and dry.  Neurological:     Mental Status: He is alert and oriented to person, place, and time.     (all labs ordered are listed, but only abnormal results are displayed) Labs Reviewed  CBC - Abnormal; Notable for the following components:      Result Value   WBC 10.9 (*)    All other components within normal limits  BASIC METABOLIC PANEL WITH GFR  TSH  TROPONIN T, HIGH SENSITIVITY    EKG: EKG Interpretation Date/Time:  Monday April 21 2024 16:43:35 EDT Ventricular Rate:  85 PR Interval:  140 QRS Duration:  92 QT  Interval:  366 QTC Calculation: 435 R Axis:   24  Text Interpretation: Normal sinus rhythm Normal ECG No previous ECGs available Confirmed by Doretha Folks ([id number]) on 04/21/2024 6:03:00 PM  Radiology: DG Chest 2 View Result Date: 04/21/2024 CLINICAL DATA:  Cardiac palpitations EXAM: CHEST - 2 VIEW COMPARISON:  02/27/2023 FINDINGS: The lungs appear clear. Cardiac and mediastinal contours normal. No blunting of the costophrenic angles. IMPRESSION: 1. No active cardiopulmonary disease is radiographically apparent. Electronically Signed   By: Ryan Salvage M.D.   On: 04/21/2024 17:22     Procedures   Medications Ordered in the ED  acetaminophen  (TYLENOL ) tablet 1,000 mg (1,000 mg Oral Given 04/21/24 2056)                                     Medical Decision Making This patient presents to the ED for concern of palpitations, this involves an extensive number of treatment options, and is a complaint that carries with it a high risk of complications and morbidity.  The differential diagnosis includes arrhythmia, anemia, electrolyte disturbance, ACS, thyroid disorder   Co morbidities that complicate the patient evaluation  GERD   Additional history obtained:  Additional history obtained from record review External records from outside source obtained and reviewed including previous ED note   Lab Tests:  I Ordered, and personally interpreted labs.  The pertinent results include: CBC notable for mild leukocytosis of 10.9, otherwise unremarkable. BMP within normal limits. Initial troponin <15, will discontinue repeat troponin given low suspicion for ACS. TSH within normal limits.    Imaging Studies ordered:  I ordered imaging studies including CXR  I independently visualized and interpreted imaging which showed 1. No active cardiopulmonary disease is radiographically apparent.  I agree with the radiologist interpretation   Cardiac Monitoring: / EKG:  The patient was maintained on a cardiac monitor.  I personally viewed and interpreted the cardiac monitored which showed an underlying rhythm of: NSR  Problem List / ED Course / Critical interventions / Medication management  I ordered medication including Tylenol   for HA  Reevaluation of the patient after these medicines showed that the patient improved I have reviewed the patients home medicines and have made adjustments as needed   Social Determinants of Health:  Former tobacco use   Test / Admission - Considered:  Physical exam is largely unremarkable as above.  PERC negative.  Workup is reassuring as above, given that patient's symptoms been ongoing for approximately 1 year but have worsened over the past several weeks, I do feel that he would benefit from a  cardiology follow-up to discuss potential cardiac monitoring for further evaluation of his continued symptoms.  He is scheduled to follow-up with his PCP later this month, I recommend that he keep this appointment and discuss his symptoms with his PCP as well.  Strict return precautions discussed, he voiced understanding and is in agreement this plan.  He is appropriate for discharge at this time.    Amount and/or Complexity of Data Reviewed Labs: ordered. Radiology: ordered.  Risk OTC drugs.        Final diagnoses:  Palpitations    ED Discharge Orders          Ordered    Ambulatory referral to Cardiology       Comments: If you have not heard from the Cardiology office within the next 72 hours please call [phone].  04/21/24 2147               Glendia Rocky SAILOR, PA-C 04/21/24 2154    Doretha Folks, MD 04/21/24 [phone]

## 2024-04-21 NOTE — Discharge Instructions (Signed)
 Follow-up with your PCP as scheduled, please discuss your symptoms with your PCP at that visit.  I have provided you with an ambulatory referral to the cardiologist, you will be hearing from their office sometime this week to schedule follow-up in regard to your palpitations. If you do not hear from their office, you may contact their office directly at [phone] to schedule follow-up.  Return to the emergency department if your symptoms worsen.

## 2024-04-21 NOTE — ED Triage Notes (Signed)
 Reports palpations x a year with worsening over the last 2 months. Denies CP or SHOB.

## 2024-04-21 NOTE — ED Notes (Signed)
 Reviewed AVS/discharge instruction with patient. Time allotted for and all questions answered. Patient is agreeable for d/c and escorted to ed exit by staff.

## 2024-04-23 NOTE — Progress Notes (Deleted)
  Cardiology Office Note   Date:  04/23/2024  ID:  DEUNTAE KOCSIS, DOB [birth year], MRN [id number] PCP: Bulah Alm GORMAN DEVONNA  [facility] Providers Cardiologist:  None { Click to update primary MD,subspecialty MD or APP then REFRESH:1}    History of Present Illness Demarkis L Zylstra is a 28 y.o. male with a past medical history of obesity, OSA.  Presents today for evaluation of palpitations  Patient was seen in the ED on 10/13 for evaluation of palpitations.  Reported that palpitations have been going on for 1 year but had been more frequent over the past month.  Also had complained of chest pressure that seemed to take his breath away. EKG showed sinus rhythm with heart rate 85 bpm.  BMP, CBC grossly within normal limits.  TSH within normal limits.  Troponin negative.  Chest x-ray showed no active cardiopulmonary disease    ROS: ***  Studies Reviewed      *** Risk Assessment/Calculations {Does this patient have ATRIAL FIBRILLATION?:[phone]} No BP recorded.  {Refresh Note OR Click here to enter BP  :1}***       Physical Exam VS:  There were no vitals taken for this visit.       Wt Readings from Last 3 Encounters:  02/27/23 (!) 380 lb (172.4 kg)  03/19/19 (!) 338 lb 6.4 oz (153.5 kg)  09/04/18 (!) 322 lb 6.4 oz (146.2 kg)    GEN: Well nourished, well developed in no acute distress NECK: No JVD; No carotid bruits CARDIAC: ***RRR, no murmurs, rubs, gallops RESPIRATORY:  Clear to auscultation without rales, wheezing or rhonchi  ABDOMEN: Soft, non-tender, non-distended EXTREMITIES:  No edema; No deformity   ASSESSMENT AND PLAN ***    {Are you ordering a CV Procedure (e.g. stress test, cath, DCCV, TEE, etc)?   Press F2        :[id number]}  Dispo: ***  Signed, Rollo FABIENE Louder, PA-C

## 2024-05-07 ENCOUNTER — Ambulatory Visit: Payer: Self-pay | Admitting: Cardiology

## 2024-05-30 ENCOUNTER — Ambulatory Visit: Payer: Self-pay | Attending: Physician Assistant | Admitting: Physician Assistant

## 2024-07-06 ENCOUNTER — Encounter (HOSPITAL_BASED_OUTPATIENT_CLINIC_OR_DEPARTMENT_OTHER): Payer: Self-pay

## 2024-07-06 ENCOUNTER — Emergency Department (HOSPITAL_BASED_OUTPATIENT_CLINIC_OR_DEPARTMENT_OTHER)
Admission: EM | Admit: 2024-07-06 | Discharge: 2024-07-06 | Disposition: A | Discharge status: Home / Self Care | Payer: Self-pay | Attending: Emergency Medicine | Admitting: Emergency Medicine

## 2024-07-06 DIAGNOSIS — H938X2 Other specified disorders of left ear: Secondary | ICD-10-CM

## 2024-07-06 DIAGNOSIS — H9202 Otalgia, left ear: Secondary | ICD-10-CM | POA: Insufficient documentation

## 2024-07-06 NOTE — ED Provider Notes (Signed)
 "  EMERGENCY DEPARTMENT AT [facility] Provider Note   [name]: [id number] Arrival date & time: 07/06/24  0744     History Chief Complaint  Patient presents with   left ear clogged    HPI: Shaun Gutierrez is a 28 y.o. male with history pertinent for elevated BMI, ADHD who presents complaining of left ear fullness. Patient arrived via POV.  History provided by patient.  No interpreter required during this encounter.  Patient reports that he has had a sensation of left ear fullness as well as decreased hearing for approximately 2 months.  Reports that 3 months ago he was diagnosed with bronchitis and completed a course of amoxicillin , denies any other recent antibiotics.  Denies any recent trauma or falls.  Reports that he occasionally will have ringing in the ear.  Does not have any dizziness, weakness, headache.  Has not had any fevers, chills.  Presents due to the duration he was concerned that he had an emergent process causing his symptoms.  Patient's recorded medical, surgical, social, medication list and allergies were reviewed in the Snapshot window as part of the initial history.   Prior to Admission medications  Medication Sig Start Date End Date Taking? Authorizing Provider  ketorolac  (TORADOL ) 10 MG tablet Take 1 tablet (10 mg total) by mouth every 6 (six) hours as needed for moderate pain. 02/27/23   Ula Prentice SAUNDERS, MD  methocarbamol  (ROBAXIN ) 750 MG tablet Take 1 tablet (750 mg total) by mouth every 8 (eight) hours as needed for muscle spasms. 02/27/23   Ula Prentice SAUNDERS, MD  omeprazole (PRILOSEC) 40 MG capsule Take 40 mg by mouth daily. 05/30/22   [provider]     Allergies: Patient has no known allergies.   Review of Systems   ROS as per HPI  Physical Exam Updated Vital Signs BP 125/82 (BP Location: Right Arm)   Pulse 76   Temp 98.1 F (36.7 C) (Oral)   Resp 16   SpO2 97%  Physical Exam Vitals and nursing note reviewed.   Constitutional:      General: He is not in acute distress.    Appearance: He is well-developed.  HENT:     Head: Normocephalic and atraumatic.     Right Ear: Tympanic membrane and ear canal normal. There is no impacted cerumen.     Left Ear: Tympanic membrane and ear canal normal. There is no impacted cerumen.     Ears:     Comments: No pain, swelling, erythema, tenderness of the mastoid Eyes:     Conjunctiva/sclera: Conjunctivae normal.  Cardiovascular:     Rate and Rhythm: Normal rate and regular rhythm.     Heart sounds: No murmur heard. Pulmonary:     Effort: Pulmonary effort is normal. No respiratory distress.     Breath sounds: Normal breath sounds.  Abdominal:     Palpations: Abdomen is soft.     Tenderness: There is no abdominal tenderness.  Musculoskeletal:        General: No swelling.     Cervical back: Neck supple.  Skin:    General: Skin is warm and dry.     Capillary Refill: Capillary refill takes less than 2 seconds.  Neurological:     Mental Status: He is alert.     GCS: GCS eye subscore is 4. GCS verbal subscore is 5. GCS motor subscore is 6.     Coordination: Finger-Nose-Finger Test and Heel to [employer] normal.  Gait: Gait normal.  Psychiatric:        Mood and Affect: Mood normal.     ED Course/ Medical Decision Making/ A&P    Procedures Procedures   Medications Ordered in ED Medications - No data to display  Medical Decision Making:   Shaun Gutierrez is a 28 y.o. male who presents for left ear fullness as per above.  Physical exam is pertinent for no focal abnormalities.   The differential includes but is not limited to impacted cerumen, middle ear effusion, AOM, mastoiditis, posterior stroke, Mnire's, aminoglycoside toxicity.  Independent historian: None  External data reviewed: No pertinent external data  Labs: Not indicated  Radiology: Not indicated No results found.  EKG/Medicine tests: Not indicated EKG Interpretation:                   Interventions: None  See the EMR for full details regarding lab and imaging results.  On exam, patient is well-appearing, normal neuroexam including coordination, no disequilibrium, thus doubt posterior stroke.  No mastoid erythema or tenderness, doubt mastoiditis.  No recent aminoglycoside antibiotics.  No erythema, bulging of TM, doubt AOM.  No fluid behind TM, doubt middle ear effusion.  No cerumen in ear, doubt cerumen impaction.  Overall patient with a reassuring ear exam as well as general physical exam including neurologic exam.  Etiology of patient's symptoms is not fully elucidated, however presentation is reassuring against underlying emergent pathology, do feel that patient requires further workup by ENT specialist, however given chronicity of symptoms, stable vitals and reassuring physical exam, do not feel that he requires emergent consult in the ED.  Will refer to ENT outpatient, patient is amenable to this plan.  Presentation is most consistent with acute uncomplicated illness and I did consider and rule out acute life/limb-threatening illness  Discussion of management or test interpretations with external provider(s): Not indicated  Risk Drugs:None  Disposition: DISCHARGE: I believe that the patient is safe for discharge home with outpatient follow-up. Patient was informed of all pertinent physical exam, laboratory, and imaging findings. Patient's suspected etiology of their symptom presentation was discussed with the patient and all questions were answered. We discussed following up with PCP, ENT. I provided thorough ED return precautions. The patient feels safe and comfortable with this plan.  MDM generated using voice dictation software and may contain dictation errors.  Please contact me for any clarification or with any questions.  Clinical Impression:  1. Ear fullness, left      Data Unavailable   Final Clinical Impression(s) / ED Diagnoses Final  diagnoses:  Ear fullness, left    Rx / DC Orders ED Discharge Orders     None        Rogelia Jerilynn RAMAN, MD 07/06/24 [phone]  "

## 2024-07-06 NOTE — ED Triage Notes (Signed)
 He c/o diminished hearing in left ear without pain or fever x ~ 2 months.

## 2024-07-06 NOTE — ED Notes (Signed)
 DC paperwork given and verbally understood.

## 2024-07-06 NOTE — Discharge Instructions (Signed)
 Shaun Gutierrez  Thank you for allowing us  to take care of you today.  You came to the Emergency Department today because you developed sensation of fullness and decreased hearing in your ear about 2 months ago.  Here in the emergency department you do not have any other neurologic symptoms or findings on exam that are concerning for stroke.  You did not have any signs of inflammation or infection of the bone behind your ear, you do not have evidence of an ear infection, nor fluid behind your ear, and you do not have any earwax impaction.  Overall, it is unclear what is causing your symptoms, however your symptoms are less concerning for an emergent condition.  We do feel that you need further evaluation by a specialist in ears who is a further expert in conditions of the ears including the nonemergent condition in which emergency doctors are less specialized.  We are giving you a referral to follow-up with the ENT.   To-Do: 1. Please follow-up with your primary doctor within 1 - 2 weeks / as soon as possible.   Please return to the Emergency Department or call 911 if you experience have worsening of your symptoms, have difficulty moving or feeling part of your body that usually are able to move and feel, chest pain, shortness of breath, severe or significantly worsening pain, high fever, severe confusion, pass out or have any reason to think that you need emergency medical care.   We hope you feel better soon.   Mitzie Later, MD Department of Emergency Medicine [facility]
